# Patient Record
Sex: Female | Born: 1945 | Race: White | Hispanic: No | Marital: Married | Smoking: Former smoker
Health system: Southern US, Community
[De-identification: ages and names within clinical notes are randomized; demographics above are authoritative.]

## PROBLEM LIST (undated history)

## (undated) DIAGNOSIS — I10 Essential (primary) hypertension: Secondary | ICD-10-CM

## (undated) HISTORY — PX: TOTAL KNEE ARTHROPLASTY: SHX125

## (undated) HISTORY — PX: BACK SURGERY: SHX140

---

## 2018-06-07 ENCOUNTER — Emergency Department (HOSPITAL_COMMUNITY): Payer: Federal, State, Local not specified - PPO

## 2018-06-07 ENCOUNTER — Encounter (HOSPITAL_COMMUNITY): Payer: Self-pay | Admitting: Emergency Medicine

## 2018-06-07 ENCOUNTER — Emergency Department (HOSPITAL_COMMUNITY)
Admission: EM | Admit: 2018-06-07 | Discharge: 2018-06-08 | Disposition: A | Discharge status: Home / Self Care | Payer: Federal, State, Local not specified - PPO | Attending: Emergency Medicine | Admitting: Emergency Medicine

## 2018-06-07 DIAGNOSIS — Z79899 Other long term (current) drug therapy: Secondary | ICD-10-CM | POA: Insufficient documentation

## 2018-06-07 DIAGNOSIS — I10 Essential (primary) hypertension: Secondary | ICD-10-CM | POA: Diagnosis not present

## 2018-06-07 DIAGNOSIS — Z7982 Long term (current) use of aspirin: Secondary | ICD-10-CM | POA: Insufficient documentation

## 2018-06-07 DIAGNOSIS — Z87891 Personal history of nicotine dependence: Secondary | ICD-10-CM | POA: Diagnosis not present

## 2018-06-07 DIAGNOSIS — R404 Transient alteration of awareness: Secondary | ICD-10-CM

## 2018-06-07 DIAGNOSIS — R410 Disorientation, unspecified: Secondary | ICD-10-CM | POA: Diagnosis present

## 2018-06-07 HISTORY — DX: Essential (primary) hypertension: I10

## 2018-06-07 LAB — COMPREHENSIVE METABOLIC PANEL
ALBUMIN: 4.2 g/dL (ref 3.5–5.0)
ALT: 30 U/L (ref 0–44)
AST: 29 U/L (ref 15–41)
Alkaline Phosphatase: 77 U/L (ref 38–126)
Anion gap: 12 (ref 5–15)
BILIRUBIN TOTAL: 1 mg/dL (ref 0.3–1.2)
BUN: 23 mg/dL (ref 8–23)
CO2: 27 mmol/L (ref 22–32)
CREATININE: 1.2 mg/dL — AB (ref 0.44–1.00)
Calcium: 9.9 mg/dL (ref 8.9–10.3)
Chloride: 96 mmol/L — ABNORMAL LOW (ref 98–111)
GFR calc Af Amer: 51 mL/min — ABNORMAL LOW (ref >60)
GFR, EST NON AFRICAN AMERICAN: 44 mL/min — AB (ref >60)
GLUCOSE: 115 mg/dL — AB (ref 70–99)
POTASSIUM: 3.8 mmol/L (ref 3.5–5.1)
Sodium: 135 mmol/L (ref 135–145)
Total Protein: 7.1 g/dL (ref 6.5–8.1)

## 2018-06-07 LAB — CBC
HCT: 45.6 % (ref 36.0–46.0)
Hemoglobin: 14.9 g/dL (ref 12.0–15.0)
MCH: 30.2 pg (ref 26.0–34.0)
MCHC: 32.7 g/dL (ref 30.0–36.0)
MCV: 92.5 fL (ref 78.0–100.0)
PLATELETS: 271 10*3/uL (ref 150–400)
RBC: 4.93 MIL/uL (ref 3.87–5.11)
RDW: 13 % (ref 11.5–15.5)
WBC: 8.6 10*3/uL (ref 4.0–10.5)

## 2018-06-07 LAB — I-STAT CHEM 8, ED
BUN: 30 mg/dL — AB (ref 8–23)
CHLORIDE: 96 mmol/L — AB (ref 98–111)
CREATININE: 1.3 mg/dL — AB (ref 0.44–1.00)
Calcium, Ion: 1.17 mmol/L (ref 1.15–1.40)
GLUCOSE: 110 mg/dL — AB (ref 70–99)
HCT: 46 % (ref 36.0–46.0)
Hemoglobin: 15.6 g/dL — ABNORMAL HIGH (ref 12.0–15.0)
POTASSIUM: 3.7 mmol/L (ref 3.5–5.1)
Sodium: 134 mmol/L — ABNORMAL LOW (ref 135–145)
TCO2: 29 mmol/L (ref 22–32)

## 2018-06-07 LAB — DIFFERENTIAL
ABS IMMATURE GRANULOCYTES: 0 10*3/uL (ref 0.0–0.1)
Basophils Absolute: 0.1 10*3/uL (ref 0.0–0.1)
Basophils Relative: 1 %
EOS ABS: 0 10*3/uL (ref 0.0–0.7)
Eosinophils Relative: 1 %
IMMATURE GRANULOCYTES: 0 %
Lymphocytes Relative: 9 %
Lymphs Abs: 0.8 10*3/uL (ref 0.7–4.0)
MONOS PCT: 5 %
Monocytes Absolute: 0.5 10*3/uL (ref 0.1–1.0)
NEUTROS ABS: 7.3 10*3/uL (ref 1.7–7.7)
Neutrophils Relative %: 84 %

## 2018-06-07 LAB — I-STAT TROPONIN, ED: Troponin i, poc: 0.02 ng/mL (ref 0.00–0.08)

## 2018-06-07 LAB — PROTIME-INR
INR: 1.05
Prothrombin Time: 13.6 seconds (ref 11.4–15.2)

## 2018-06-07 LAB — APTT: APTT: 29 s (ref 24–36)

## 2018-06-07 MED ORDER — SODIUM CHLORIDE 0.9 % IV BOLUS
1000.0000 mL | Freq: Once | INTRAVENOUS | Status: AC
  Administered 2018-06-07: 1000 mL via INTRAVENOUS

## 2018-06-07 NOTE — ED Triage Notes (Signed)
Patient's spouse reported confusion and disoriented with diaphoresis at approx. 5:30 pm this afternoon , pt. is alert and orientedx4 at arrival , speech clear with no facial asymmetry , equal grips with no arm drift , denies pain or dizziness.

## 2018-06-07 NOTE — ED Notes (Signed)
Pt taken to MRI  

## 2018-06-07 NOTE — ED Provider Notes (Signed)
[facility] EMERGENCY DEPARTMENT Provider Note   CSN: [id number] Arrival date & time: 06/07/18  2129     History   Chief Complaint Chief Complaint  Patient presents with  . Confusion    HPI Patty Perry is a 72 y.o. female.  HPI  This is a 72 year old female who presents with altered mental status.  Patient is visiting from [place].  Per the patient's husband and son, she had 2 discrete episodes that lasted approximately 30 minutes of altered mental status.  They state that she did not appear to be responding to them during these episodes.  She was not passed out but just did not seem to respond appropriately.  She had one similar episodes of this in June.  No history of stroke.  No noted strokelike symptoms including speech difficulty, facial droop, numbness, weakness.  Patient herself has no recollection of these events.  Family she is now at baseline.  Denies any recent infectious symptoms including fever, dysuria, cough.  No history of cardiac disease or renal disease.  Denies alcohol or drug use.  Past Medical History:  Diagnosis Date  . Hypertension     There are no active problems to display for this patient.   OB History   None      Home Medications    Prior to Admission medications   Medication Sig Start Date End Date Taking? Authorizing Provider  aspirin-acetaminophen-caffeine (EXCEDRIN MIGRAINE) [phone] MG tablet Take 1-2 tablets by mouth every 6 (six) hours as needed for headache.   Yes [provider]  FLUoxetine (PROZAC) 20 MG capsule Take 40 mg by mouth daily.   Yes [provider]  hydrochlorothiazide (MICROZIDE) 12.5 MG capsule Take 12.5 mg by mouth daily.   Yes [provider]  metoprolol succinate (TOPROL-XL) 100 MG 24 hr tablet Take 100 mg by mouth daily. Take with or immediately following a meal.   Yes [provider]  simvastatin (ZOCOR) 10 MG tablet Take 20 mg by mouth daily.    Yes  [provider]  aspirin EC 325 MG tablet Take 1 tablet (325 mg total) by mouth daily. 06/08/18   Leeza Heiner, Mayer Masker, MD    Family History No family history on file.  Social History Social History   Tobacco Use  . Smoking status: Former Games developer  . Smokeless tobacco: Never Used  Substance Use Topics  . Alcohol use: Yes  . Drug use: Never     Allergies   Penicillins   Review of Systems Review of Systems  Constitutional: Positive for diaphoresis. Negative for fever.  Respiratory: Negative for shortness of breath.   Cardiovascular: Negative for chest pain.  Gastrointestinal: Negative for abdominal pain, nausea and vomiting.  Genitourinary: Negative for dysuria.  Neurological: Negative for facial asymmetry, speech difficulty, weakness and numbness.       Altered mental status  Psychiatric/Behavioral: Positive for confusion.  All other systems reviewed and are negative.    Physical Exam Updated Vital Signs BP (!) 129/55   Pulse (!) 59   Temp 98.3 F (36.8 C)   Resp 13   SpO2 100%   Physical Exam  Constitutional: She is oriented to person, place, and time. She appears well-developed and well-nourished. No distress.  HENT:  Head: Normocephalic and atraumatic.  Eyes: Pupils are equal, round, and reactive to light.  Neck: Normal range of motion. Neck supple.  Cardiovascular: Normal rate, regular rhythm and normal heart sounds.  Pulmonary/Chest: Effort normal and breath sounds normal.  No respiratory distress. She has no wheezes.  Abdominal: Soft. Bowel sounds are normal. There is no tenderness.  Neurological: She is alert and oriented to person, place, and time.  Patient can name and repeat, cranial nerves II through XII intact, no drift, 5 out of 5 strength in all 4 extremities, no dysmetria to finger-nose-finger but patient is slightly more hesitant on the right  Skin: Skin is warm and dry.  Psychiatric: She has a normal mood and affect.  Nursing note and  vitals reviewed.    ED Treatments / Results  Labs (all labs ordered are listed, but only abnormal results are displayed) Labs Reviewed  COMPREHENSIVE METABOLIC PANEL - Abnormal; Notable for the following components:      Result Value   Chloride 96 (*)    Glucose, Bld 115 (*)    Creatinine, Ser 1.20 (*)    GFR calc non Af Amer 44 (*)    GFR calc Af Amer 51 (*)    All other components within normal limits  I-STAT CHEM 8, ED - Abnormal; Notable for the following components:   Sodium 134 (*)    Chloride 96 (*)    BUN 30 (*)    Creatinine, Ser 1.30 (*)    Glucose, Bld 110 (*)    Hemoglobin 15.6 (*)    All other components within normal limits  PROTIME-INR  APTT  CBC  DIFFERENTIAL  ETHANOL  RAPID URINE DRUG SCREEN, HOSP PERFORMED  URINALYSIS, ROUTINE W REFLEX MICROSCOPIC  I-STAT TROPONIN, ED    EKG EKG Interpretation  Date/Time:  Monday June 07 2018 21:47:43 EDT Ventricular Rate:  60 PR Interval:  168 QRS Duration: 88 QT Interval:  422 QTC Calculation: 422 R Axis:   3 Text Interpretation:  Normal sinus rhythm Possible Left atrial enlargement Nonspecific ST and T wave abnormality Abnormal ECG Confirmed by Ross Marcus ([id number]) on 06/07/2018 11:04:36 PM   Radiology Ct Head Wo Contrast  Result Date: 06/07/2018 CLINICAL DATA:  72 year old female with confusion. EXAM: CT HEAD WITHOUT CONTRAST TECHNIQUE: Contiguous axial images were obtained from the base of the skull through the vertex without intravenous contrast. COMPARISON:  None. FINDINGS: Brain: There is mild age-related atrophy and chronic microvascular ischemic changes. There is an area of CSF density in the right posterior parietal lobe with communication to the right lateral ventricle. This may represent ex vacuo dilatation of the lateral ventricle or encephalomalacia related to prior infarct. Correlation with clinical exam and direct comparison with prior images, if available, recommended. There is no acute  intracranial hemorrhage. Number line shift. No extra-axial fluid collection. Vascular: No hyperdense vessel or unexpected calcification. Skull: Normal. Negative for fracture or focal lesion. Sinuses/Orbits: No acute finding. Other: None IMPRESSION: 1. No acute intracranial hemorrhage. 2. Mild age-related atrophy and chronic microvascular ischemic changes. 3. Focal a slight dilatation of the right lateral ventricle versus encephalomalacia related to prior infarct in the right parietal lobe. Direct comparison with prior images, if available, recommended. Electronically Signed   By: Elgie Collard M.D.   On: 06/07/2018 22:32   Mr Brain Wo Contrast  Result Date: 06/08/2018 CLINICAL DATA:  Confusion beginning at 5:30 p.m. History of hypertension. EXAM: MRI HEAD WITHOUT CONTRAST TECHNIQUE: Multiplanar, multiecho pulse sequences of the brain and surrounding structures were obtained without intravenous contrast. COMPARISON:  CT HEAD June 07, 2018 FINDINGS: INTRACRANIAL CONTENTS: No reduced diffusion to suggest acute ischemia. No susceptibility artifact to suggest hemorrhage. No suspicious parenchymal signal, masses, mass effect. Mild ex vacuo dilatation  RIGHT occipital horn with associated porencephalic cyst nearly extending to the arachnoid space within RIGHT parietal lobe. No hydrocephalus. Patchy supratentorial white matter FLAIR T2 hyperintensities. No abnormal extra-axial fluid collections. No extra-axial masses. VASCULAR: Normal major intracranial vascular flow voids present at skull base. SKULL AND UPPER CERVICAL SPINE: No abnormal sellar expansion. No suspicious calvarial bone marrow signal. Craniocervical junction maintained. SINUSES/ORBITS: Mild paranasal sinus mucosal thickening. Trace LEFT mastoid effusion.The included ocular globes and orbital contents are non-suspicious. OTHER: None. IMPRESSION: 1. No acute intracranial process. 2. Mild-to-moderate chronic small vessel ischemic changes. 3. RIGHT  parietal porencephalic cyst. Electronically Signed   By: Awilda Metro M.D.   On: 06/08/2018 00:51    Procedures Procedures (including critical care time)  Medications Ordered in ED Medications  sodium chloride 0.9 % bolus 1,000 mL (0 mLs Intravenous Stopped 06/08/18 0106)     Initial Impression / Assessment and Plan / ED Course  I have reviewed the triage vital signs and the nursing notes.  Pertinent labs & imaging results that were available during my care of the patient were reviewed by me and considered in my medical decision making (see chart for details).     Patient presents with her family with concerns for several discrete episodes of alteration of awareness.  She is back to her baseline.  She appears neurologically intact.  Vital signs are reassuring.  Denies any recent infectious symptoms.  Question TIA versus seizure.  No history of seizure.  Denies any alcohol or drug use.  CT scan questionable for encephalomalacia related to prior infarct.  No known prior history of stroke.  Will obtain MRI.  Urinalysis without infection.  Basic lab work is largely reassuring.  She does have some mild AKI.  She was given fluids.  UDS without any drugs.  Alcohol level negative.  MRI with out acute stroke.  She does have some small vessel ischemic changes in her right parietal porencephalic cyst.  Patient has remained neurologically intact while in the emergency room.  Her ABCD 2 score is 3 which is low risk.  We will have her follow-up with her primary physician for recheck and TIA work-up.  Additionally she was advised not to drive or operate heavy machinery given consideration of seizure.  She will be placed on a daily aspirin.  If she develops any strokelike symptoms she needs to be reevaluated immediately.  After history, exam, and medical workup I feel the patient has been appropriately medically screened and is safe for discharge home. Pertinent diagnoses were discussed with the patient.  Patient was given return precautions.   Final Clinical Impressions(s) / ED Diagnoses   Final diagnoses:  Altered level of consciousness    ED Discharge Orders         Ordered    aspirin EC 325 MG tablet  Daily     06/08/18 0149           Shon Baton, MD 06/08/18 0202

## 2018-06-08 LAB — RAPID URINE DRUG SCREEN, HOSP PERFORMED
Amphetamines: NOT DETECTED
BARBITURATES: NOT DETECTED
Benzodiazepines: NOT DETECTED
Cocaine: NOT DETECTED
Opiates: NOT DETECTED
Tetrahydrocannabinol: NOT DETECTED

## 2018-06-08 LAB — URINALYSIS, ROUTINE W REFLEX MICROSCOPIC
Bilirubin Urine: NEGATIVE
GLUCOSE, UA: NEGATIVE mg/dL
Hgb urine dipstick: NEGATIVE
KETONES UR: NEGATIVE mg/dL
LEUKOCYTES UA: NEGATIVE
NITRITE: NEGATIVE
PH: 5 (ref 5.0–8.0)
Protein, ur: NEGATIVE mg/dL
Specific Gravity, Urine: 1.014 (ref 1.005–1.030)

## 2018-06-08 LAB — ETHANOL: Alcohol, Ethyl (B): <10 mg/dL (ref <10)

## 2018-06-08 MED ORDER — ASPIRIN EC 325 MG PO TBEC: 325.0000 mg | DELAYED_RELEASE_TABLET | Freq: Every day | ORAL | 0 refills | Status: AC

## 2018-06-08 NOTE — ED Notes (Signed)
Pt returned from MRI, ambulatory to restroom with steady gait.

## 2018-06-08 NOTE — Discharge Instructions (Addendum)
You were seen today for altered mental status.  Your work-up is largely reassuring.  There is a chance that you could have had a TIA.  You are relatively low risk but need to follow-up with your primary doctor for TIA work-up.  In the meantime take a daily aspirin.  Do not drive or operate heavy machinery until you are cleared by your doctor.  If you developed neurologic symptoms including weakness, speech difficulty, facial droop you should be reevaluated immediately.

## 2019-08-21 IMAGING — MR MR HEAD W/O CM
10 of 11 series · 42 of 48 positions shown · non-contrast
Comparison: CT HEAD June 07, 2018

CLINICAL DATA: Confusion beginning at [DATE] p.m.. History of
hypertension.

EXAM:
MRI HEAD WITHOUT CONTRAST
TECHNIQUE: Multiplanar, multiecho pulse sequences of the brain and surrounding
structures were obtained without intravenous contrast.

[Series 5: DWI · axial · 4.0mm · 0.88mm/px · z∈[-65,+71]mm · 6 of 70 slices shown (1 of 4)]
[im 1/70]
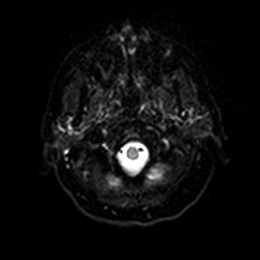
[im 14/70]
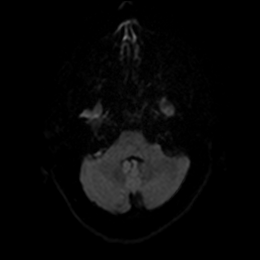
[im 28/70]
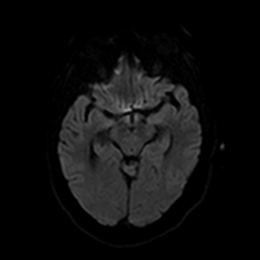
[im 42/70]
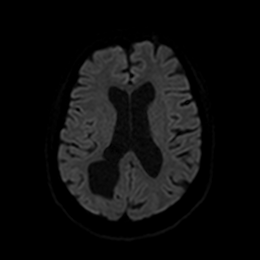
[im 56/70]
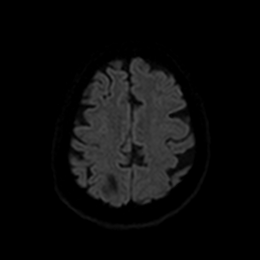
[im 70/70]
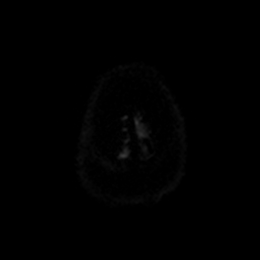

[Series 6: DWI · axial · 4.0mm · 0.88mm/px · z∈[-65,+71]mm · 4 of 35 slices shown (2 of 4)]
[im 1/35]
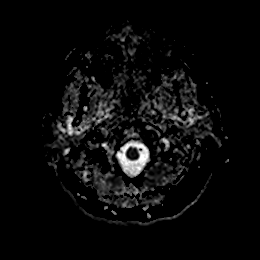
[im 12/35]
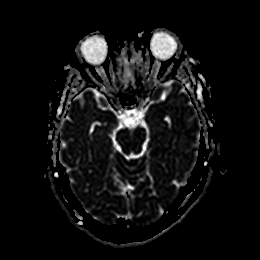
[im 23/35]
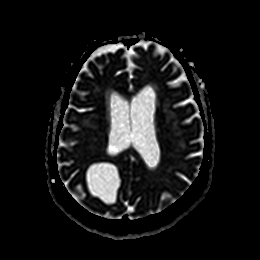
[im 35/35]
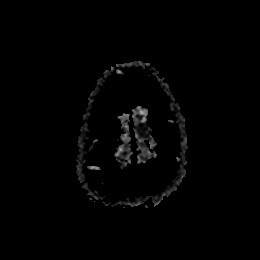

[Series 7: DWI · coronal · 4.0mm · 0.88mm/px · 7 of 66 slices shown (3 of 4)]
[im 1/66]
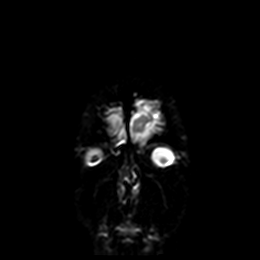
[im 11/66]
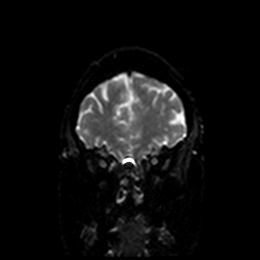
[im 22/66]
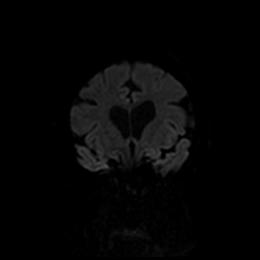
[im 33/66]
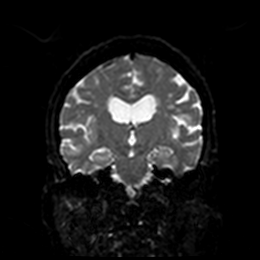
[im 44/66]
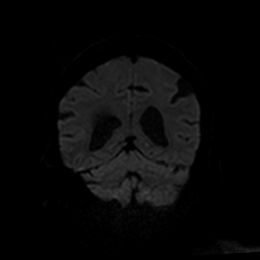
[im 55/66]
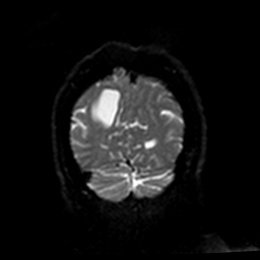
[im 66/66]
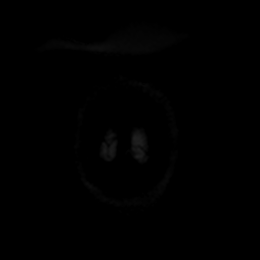

[Series 8: DWI · coronal · 4.0mm · 0.88mm/px · 3 of 33 slices shown (4 of 4)]
[im 1/33]
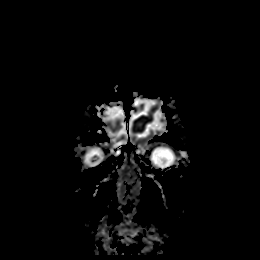
[im 17/33]
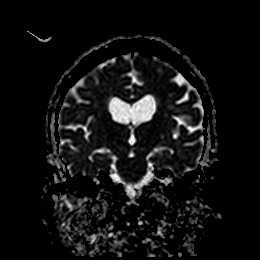
[im 33/33]
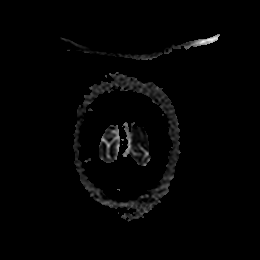

[Series 9: T1 · sagittal · 5.0mm · 0.75mm/px · 2 of 23 slices shown]
[im 1/23]
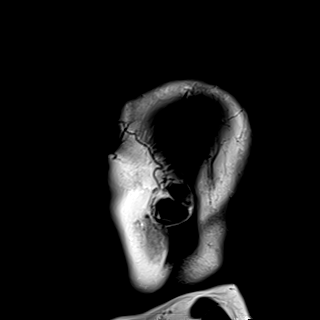
[im 23/23]
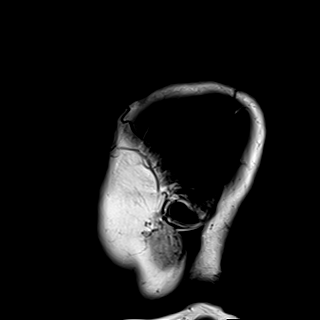

[Series 10: T2 · axial · 5.0mm · 0.72mm/px · z∈[-71,+72]mm · 3 of 25 slices shown (1 of 2)]
[im 1/25]
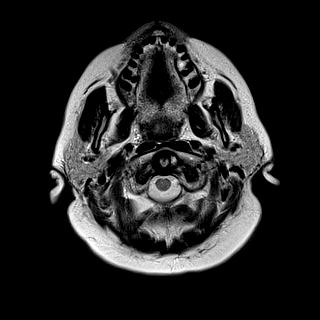
[im 13/25]
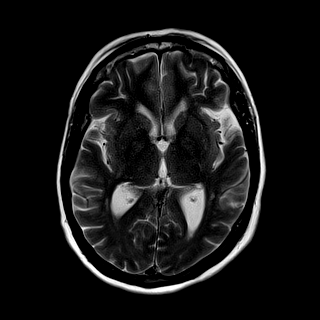
[im 25/25]
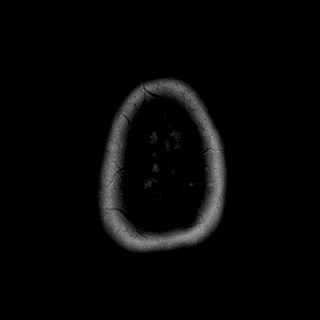

[Series 11: FLAIR · axial · 5.0mm · 0.45mm/px · z∈[-73,+70]mm · 3 of 25 slices shown]
[im 1/25]
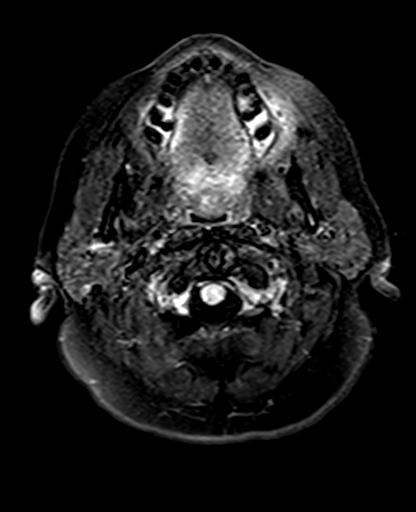
[im 13/25]
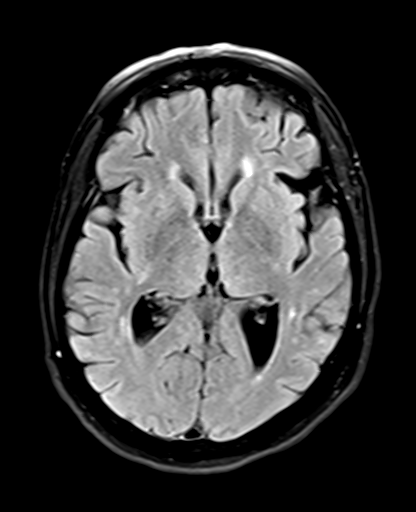
[im 25/25]
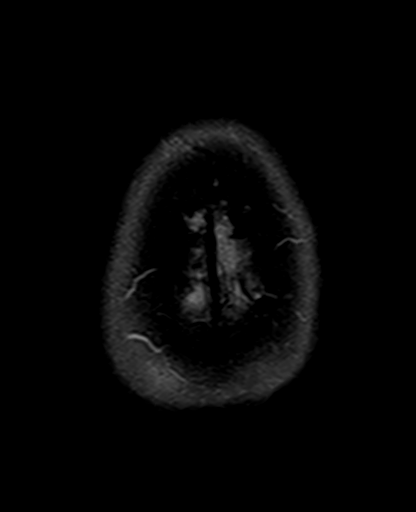

[Series 12: swi_images · axial · 3.0mm · 0.90mm/px · z∈[-84,+92]mm · 6 of 60 slices shown]
[im 1/60]
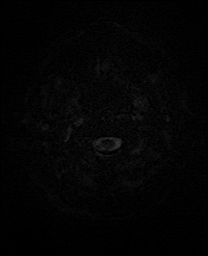
[im 12/60]
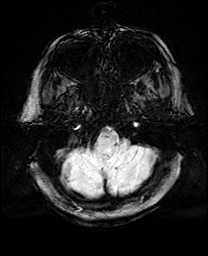
[im 24/60]
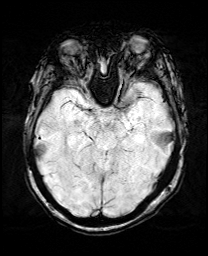
[im 36/60]
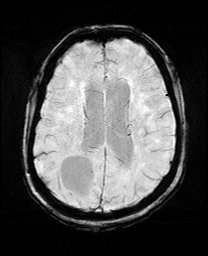
[im 48/60]
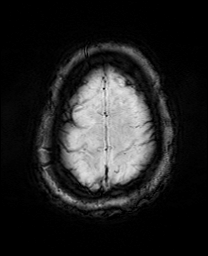
[im 60/60]
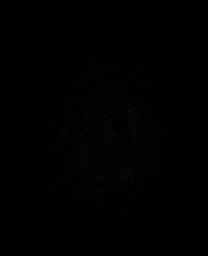

[Series 13: mip_images(sw) · axial · 24.0mm · 0.90mm/px · z∈[-74,+81]mm · 5 of 53 slices shown]
[im 1/53]
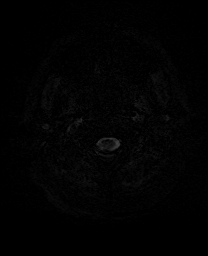
[im 14/53]
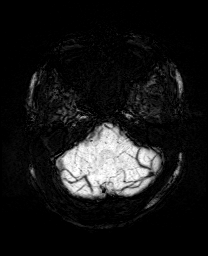
[im 27/53]
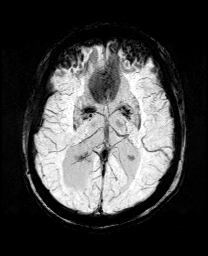
[im 40/53]
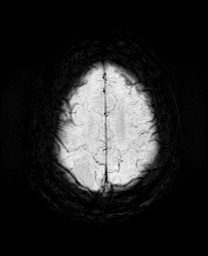
[im 53/53]
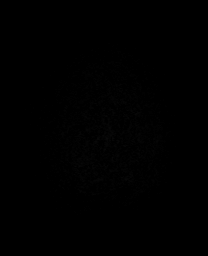

[Series 15: T2 · coronal · 5.0mm · 0.34mm/px · 3 of 29 slices shown (2 of 2)]
[im 1/29]
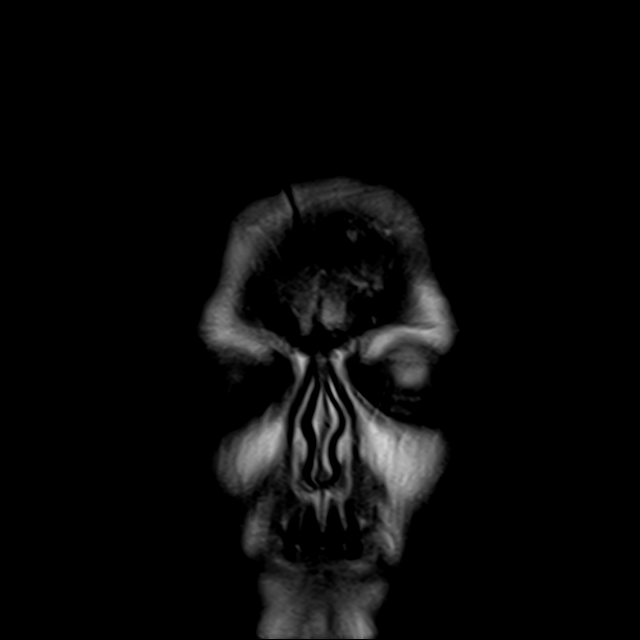
[im 15/29]
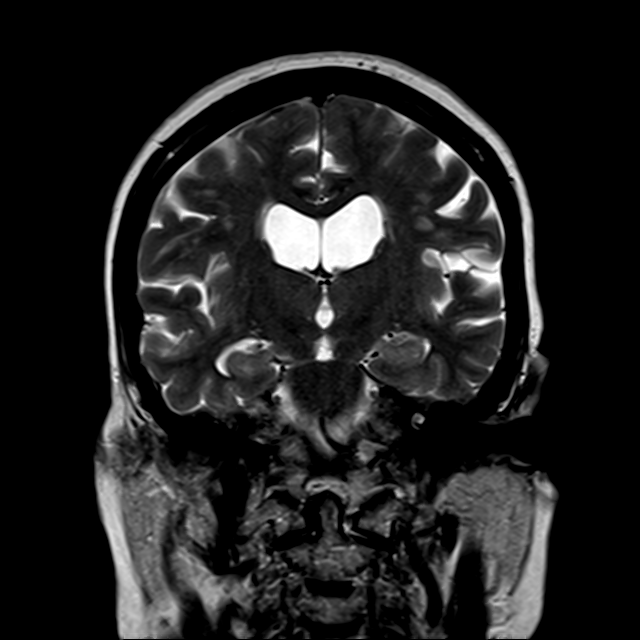
[im 29/29]
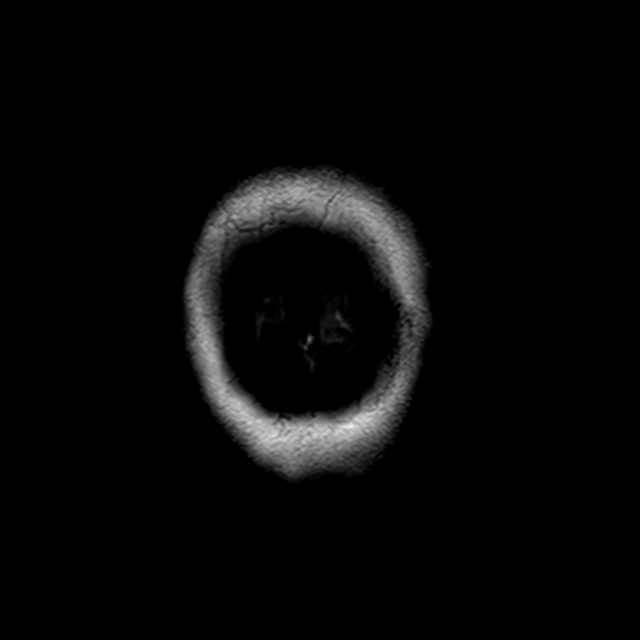

[42 of 48 positions shown; findings below may reference images not displayed]

FINDINGS: INTRACRANIAL CONTENTS: No reduced diffusion to suggest acute
ischemia. No susceptibility artifact to suggest hemorrhage. No
suspicious parenchymal signal, masses, mass effect. Mild ex vacuo
dilatation RIGHT occipital horn with associated porencephalic cyst
nearly extending to the arachnoid space within RIGHT parietal lobe.
No hydrocephalus. Patchy supratentorial white matter FLAIR T2
hyperintensities. No abnormal extra-axial fluid collections. No
extra-axial masses.

VASCULAR: Normal major intracranial vascular flow voids present at
skull base.

SKULL AND UPPER CERVICAL SPINE: No abnormal sellar expansion. No
suspicious calvarial bone marrow signal. Craniocervical junction
maintained.

SINUSES/ORBITS: Mild paranasal sinus mucosal thickening. Trace LEFT
mastoid effusion.The included ocular globes and orbital contents are
non-suspicious.

OTHER: None.
IMPRESSION: 1. No acute intracranial process.
2. Mild-to-moderate chronic small vessel ischemic changes.
3. RIGHT parietal porencephalic cyst.
